# Patient Record
Sex: Female | Born: 1965 | Race: White | Hispanic: No | Smoking: Former smoker
Health system: Southern US, Community
[De-identification: ages and names within clinical notes are randomized; demographics above are authoritative.]

## PROBLEM LIST (undated history)

## (undated) DIAGNOSIS — Z8719 Personal history of other diseases of the digestive system: Secondary | ICD-10-CM

## (undated) DIAGNOSIS — N289 Disorder of kidney and ureter, unspecified: Secondary | ICD-10-CM

## (undated) DIAGNOSIS — C801 Malignant (primary) neoplasm, unspecified: Secondary | ICD-10-CM

## (undated) DIAGNOSIS — J189 Pneumonia, unspecified organism: Secondary | ICD-10-CM

## (undated) DIAGNOSIS — I10 Essential (primary) hypertension: Secondary | ICD-10-CM

## (undated) DIAGNOSIS — A4902 Methicillin resistant Staphylococcus aureus infection, unspecified site: Secondary | ICD-10-CM

## (undated) DIAGNOSIS — F419 Anxiety disorder, unspecified: Secondary | ICD-10-CM

## (undated) DIAGNOSIS — K219 Gastro-esophageal reflux disease without esophagitis: Secondary | ICD-10-CM

## (undated) HISTORY — PX: BIOPSY BREAST: PRO8

## (undated) HISTORY — PX: OTHER SURGICAL HISTORY: SHX169

## (undated) HISTORY — PX: ABDOMINAL SURGERY: SHX537

---

## 2013-06-21 HISTORY — PX: OTHER SURGICAL HISTORY: SHX169

## 2018-01-13 ENCOUNTER — Emergency Department (HOSPITAL_COMMUNITY)
Admission: EM | Admit: 2018-01-13 | Discharge: 2018-01-14 | Disposition: A | Discharge status: Other Acute Inpatient Hospital | Payer: Medicaid Other | Attending: Emergency Medicine | Admitting: Emergency Medicine

## 2018-01-13 ENCOUNTER — Emergency Department (HOSPITAL_COMMUNITY): Payer: Medicaid Other

## 2018-01-13 ENCOUNTER — Encounter (HOSPITAL_COMMUNITY): Payer: Self-pay | Admitting: Emergency Medicine

## 2018-01-13 DIAGNOSIS — J189 Pneumonia, unspecified organism: Secondary | ICD-10-CM

## 2018-01-13 DIAGNOSIS — I10 Essential (primary) hypertension: Secondary | ICD-10-CM | POA: Diagnosis not present

## 2018-01-13 DIAGNOSIS — N289 Disorder of kidney and ureter, unspecified: Secondary | ICD-10-CM | POA: Diagnosis not present

## 2018-01-13 DIAGNOSIS — Z85118 Personal history of other malignant neoplasm of bronchus and lung: Secondary | ICD-10-CM | POA: Insufficient documentation

## 2018-01-13 DIAGNOSIS — Z79899 Other long term (current) drug therapy: Secondary | ICD-10-CM | POA: Diagnosis not present

## 2018-01-13 DIAGNOSIS — J9601 Acute respiratory failure with hypoxia: Secondary | ICD-10-CM | POA: Diagnosis present

## 2018-01-13 DIAGNOSIS — R042 Hemoptysis: Secondary | ICD-10-CM | POA: Diagnosis present

## 2018-01-13 DIAGNOSIS — Z87891 Personal history of nicotine dependence: Secondary | ICD-10-CM | POA: Insufficient documentation

## 2018-01-13 HISTORY — DX: Methicillin resistant Staphylococcus aureus infection, unspecified site: A49.02

## 2018-01-13 HISTORY — DX: Anxiety disorder, unspecified: F41.9

## 2018-01-13 HISTORY — DX: Gastro-esophageal reflux disease without esophagitis: K21.9

## 2018-01-13 HISTORY — DX: Personal history of other diseases of the digestive system: Z87.19

## 2018-01-13 HISTORY — DX: Disorder of kidney and ureter, unspecified: N28.9

## 2018-01-13 HISTORY — DX: Malignant (primary) neoplasm, unspecified: C80.1

## 2018-01-13 HISTORY — DX: Essential (primary) hypertension: I10

## 2018-01-13 HISTORY — DX: Pneumonia, unspecified organism: J18.9

## 2018-01-13 LAB — CBC WITH DIFFERENTIAL/PLATELET
Basophils Absolute: 0 10*3/uL (ref 0.0–0.1)
Basophils Relative: 0 %
Eosinophils Absolute: 0 10*3/uL (ref 0.0–0.7)
Eosinophils Relative: 0 %
HCT: 25.6 % — ABNORMAL LOW (ref 36.0–46.0)
Hemoglobin: 7.9 g/dL — ABNORMAL LOW (ref 12.0–15.0)
Lymphocytes Relative: 8 %
Lymphs Abs: 0.6 10*3/uL — ABNORMAL LOW (ref 0.7–4.0)
MCH: 26.3 pg (ref 26.0–34.0)
MCHC: 30.9 g/dL (ref 30.0–36.0)
MCV: 85.3 fL (ref 78.0–100.0)
Monocytes Absolute: 0.4 10*3/uL (ref 0.1–1.0)
Monocytes Relative: 5 %
Neutro Abs: 6.3 10*3/uL (ref 1.7–7.7)
Neutrophils Relative %: 87 %
Platelets: 235 10*3/uL (ref 150–400)
RBC: 3 MIL/uL — ABNORMAL LOW (ref 3.87–5.11)
RDW: 16.1 % — ABNORMAL HIGH (ref 11.5–15.5)
WBC: 7.2 10*3/uL (ref 4.0–10.5)

## 2018-01-13 LAB — BASIC METABOLIC PANEL
Anion gap: 10 (ref 5–15)
BUN: 27 mg/dL — ABNORMAL HIGH (ref 6–20)
CO2: 27 mmol/L (ref 22–32)
Calcium: 9.1 mg/dL (ref 8.9–10.3)
Chloride: 96 mmol/L — ABNORMAL LOW (ref 98–111)
Creatinine, Ser: 2.19 mg/dL — ABNORMAL HIGH (ref 0.44–1.00)
GFR calc Af Amer: 29 mL/min — ABNORMAL LOW (ref >60)
GFR calc non Af Amer: 25 mL/min — ABNORMAL LOW (ref >60)
Glucose, Bld: 97 mg/dL (ref 70–99)
Potassium: 3.9 mmol/L (ref 3.5–5.1)
Sodium: 133 mmol/L — ABNORMAL LOW (ref 135–145)

## 2018-01-13 LAB — URINALYSIS, ROUTINE W REFLEX MICROSCOPIC
Bilirubin Urine: NEGATIVE
Glucose, UA: NEGATIVE mg/dL
Ketones, ur: NEGATIVE mg/dL
Nitrite: NEGATIVE
Protein, ur: 30 mg/dL — AB
RBC / HPF: >50 RBC/hpf — ABNORMAL HIGH (ref 0–5)
Specific Gravity, Urine: 1.016 (ref 1.005–1.030)
WBC, UA: >50 WBC/hpf — ABNORMAL HIGH (ref 0–5)
pH: 5 (ref 5.0–8.0)

## 2018-01-13 LAB — I-STAT CG4 LACTIC ACID, ED: Lactic Acid, Venous: 1.22 mmol/L (ref 0.5–1.9)

## 2018-01-13 LAB — D-DIMER, QUANTITATIVE (NOT AT ARMC): D-Dimer, Quant: 3.33 ug/mL-FEU — ABNORMAL HIGH (ref 0.00–0.50)

## 2018-01-13 MED ORDER — BENZONATATE 100 MG PO CAPS
100.0000 mg | ORAL_CAPSULE | Freq: Once | ORAL | Status: AC
  Administered 2018-01-13: 100 mg via ORAL
  Filled 2018-01-13: qty 1

## 2018-01-13 MED ORDER — SODIUM CHLORIDE 0.9 % IV SOLN
2.0000 g | Freq: Once | INTRAVENOUS | Status: AC
  Administered 2018-01-13: 2 g via INTRAVENOUS
  Filled 2018-01-13: qty 2

## 2018-01-13 MED ORDER — SODIUM CHLORIDE 0.9 % IV BOLUS
500.0000 mL | Freq: Once | INTRAVENOUS | Status: AC
  Administered 2018-01-13: 500 mL via INTRAVENOUS

## 2018-01-13 MED ORDER — ACETAMINOPHEN 325 MG PO TABS
650.0000 mg | ORAL_TABLET | Freq: Once | ORAL | Status: AC
  Administered 2018-01-13: 650 mg via ORAL
  Filled 2018-01-13: qty 2

## 2018-01-13 MED ORDER — VANCOMYCIN HCL IN DEXTROSE 1-5 GM/200ML-% IV SOLN
1000.0000 mg | Freq: Once | INTRAVENOUS | Status: AC
  Administered 2018-01-13: 1000 mg via INTRAVENOUS
  Filled 2018-01-13: qty 200

## 2018-01-13 MED ORDER — TECHNETIUM TC 99M DIETHYLENETRIAME-PENTAACETIC ACID
32.3000 | Freq: Once | INTRAVENOUS | Status: AC | PRN
  Administered 2018-01-13: 32.3 via INTRAVENOUS

## 2018-01-13 MED ORDER — SODIUM CHLORIDE 0.9 % IV BOLUS
1000.0000 mL | Freq: Once | INTRAVENOUS | Status: AC
  Administered 2018-01-13: 1000 mL via INTRAVENOUS

## 2018-01-13 MED ORDER — TECHNETIUM TO 99M ALBUMIN AGGREGATED
3.9000 | Freq: Once | INTRAVENOUS | Status: AC | PRN
  Administered 2018-01-13: 3.9 via INTRAVENOUS

## 2018-01-13 NOTE — ED Notes (Signed)
Informed pt of need for Urine.

## 2018-01-13 NOTE — ED Notes (Signed)
Attempted to call report, nurse requesting 5 minutes

## 2018-01-13 NOTE — Progress Notes (Signed)
A consult was received from an ED physician for vancomycin + cefepime per pharmacy dosing.  The patient's profile has been reviewed for ht/wt/allergies/indication/available labs.    A one time order has been placed for cefepime 2 g IV and vancomycin 1000 mg IV.  Further antibiotics/pharmacy consults should be ordered by admitting physician if indicated.                       Thank you, Lenis Noon, PharmD, BCPS Clinical Pharmacist 01/13/2018  6:54 PM

## 2018-01-13 NOTE — ED Triage Notes (Signed)
Per EMS-states she started coughing up bright red blood last night-darker this am-states history of lung cancer-lungs clear, no other complaints

## 2018-01-13 NOTE — ED Notes (Addendum)
Mina,PA made aware of patient D-dimer.

## 2018-01-13 NOTE — ED Notes (Signed)
Patient transported to vq scan  

## 2018-01-13 NOTE — ED Notes (Signed)
Spoke with Barbara Andrews at [facility] for transport

## 2018-01-13 NOTE — ED Provider Notes (Addendum)
[facility] DEPT Provider Note   CSN: [id number] Arrival date & time: 01/13/18  1233     History   Chief Complaint Chief Complaint  Patient presents with  . coughing up blood    HPI Barbara Andrews is a 52 y.o. female with history of CKD, lung osteosarcoma, hypertension presents for evaluation of acute onset, somewhat improving hemoptysis since yesterday.  She states that due to her history of lung cancer she will have "flareups "of hemoptysis.  Yesterday she noticed she was coughing up bright red blood which was darker this morning.  She states that it typically worsens at night but improves in the mornings.  She does note shortness of breath with exertion and generalized fatigue.  She is currently in a rehabilitation facility due to progressive weakness after an ablation procedure of the lung.  Her oncologist is with [facility].  She endorses aching right-sided chest pains with cough only.  Denies fevers or chills. Has had PE in the past while undergoing chemotherapy and was on Eliquis but is not currently anticoagulated because "I was bleeding too much"  The history is provided by the patient.    Past Medical History:  Diagnosis Date  . Anxiety   . Cancer ([place])    hodgkins lymphoma - 2001, lung cancer - 2018  . GERD (gastroesophageal reflux disease)   . History of hiatal hernia   . Hypertension   . MRSA (methicillin resistant Staphylococcus aureus)   . Pneumonia   . Renal disorder     Patient Active Problem List   Diagnosis Date Noted  . Hemoptysis 01/13/2018  . Acute respiratory failure with hypoxia ([place]) 01/13/2018    Past Surgical History:  Procedure Laterality Date  . ABDOMINAL SURGERY  age 74 months   tumor removed from above ovary  . BIOPSY BREAST     to diagnose hodgkins per pt  . OTHER SURGICAL HISTORY  2015   fallopian tube and right ovary removed  . OTHER SURGICAL HISTORY     2 port a caths     OB  History   None      Home Medications    Prior to Admission medications   Medication Sig Start Date End Date Taking? Authorizing Provider  benzonatate (TESSALON) 100 MG capsule Take 200 mg by mouth 3 (three) times daily as needed for cough.   Yes [provider]  HYDROcodone-homatropine (HYCODAN) 5-1.5 MG/5ML syrup Take 5 mLs by mouth every 4 (four) hours as needed for cough.   Yes [provider]  Melatonin 3 MG TABS Take 3 mg by mouth at bedtime.   Yes [provider]  Multiple Vitamin (MULTIVITAMIN WITH MINERALS) TABS tablet Take 1 tablet by mouth daily.   Yes [provider]    Family History History reviewed. No pertinent family history.  Social History Social History   Tobacco Use  . Smoking status: Former Smoker    Years: 38.00    Types: Cigarettes    Last attempt to quit: 01/14/2015    Years since quitting: 3.0  . Smokeless tobacco: Never Used  Substance Use Topics  . Alcohol use: Never    Frequency: Never  . Drug use: Never     Allergies   Levaquin [levofloxacin in d5w]   Review of Systems Review of Systems  Constitutional: Positive for fatigue. Negative for chills and fever.  Respiratory: Positive for cough and shortness of breath.   Cardiovascular: Positive for chest pain.  Negative for leg swelling.  All other systems reviewed and are negative.    Physical Exam Updated Vital Signs BP (!) 150/94 (BP Location: Left Arm)   Pulse (!) 107   Temp (!) 101.1 F (38.4 C) (Rectal)   Resp 14   SpO2 96%   Physical Exam  Constitutional: She appears well-developed and well-nourished. No distress.  Resting in bed appears older than stated age.  HENT:  Head: Normocephalic and atraumatic.  Eyes: Conjunctivae are normal. Right eye exhibits no discharge. Left eye exhibits no discharge.  Neck: No JVD present. No tracheal deviation present.  Cardiovascular:  Tachycardic, 2+ radial and DP/PT pulses bilaterally, Homans sign  absent bilaterally, no lower extremity edema, no palpable cords, compartments are soft   Pulmonary/Chest: Effort normal and breath sounds normal. She exhibits tenderness.  Right anterior chest wall minimally tender to palpation.  No deformity, crepitus, ecchymosis, or flail segment noted  Abdominal: Soft. Bowel sounds are normal. She exhibits no distension. There is no tenderness.  Musculoskeletal: She exhibits no edema.  Neurological: She is alert.  Skin: Skin is warm and dry. No erythema.  Psychiatric: She has a normal mood and affect. Her behavior is normal.  Nursing note and vitals reviewed.    ED Treatments / Results  Labs (all labs ordered are listed, but only abnormal results are displayed) Labs Reviewed  BASIC METABOLIC PANEL - Abnormal; Notable for the following components:      Result Value   Sodium 133 (*)    Chloride 96 (*)    BUN 27 (*)    Creatinine, Ser 2.19 (*)    GFR calc non Af Amer 25 (*)    GFR calc Af Amer 29 (*)    All other components within normal limits  CBC WITH DIFFERENTIAL/PLATELET - Abnormal; Notable for the following components:   RBC 3.00 (*)    Hemoglobin 7.9 (*)    HCT 25.6 (*)    RDW 16.1 (*)    Lymphs Abs 0.6 (*)    All other components within normal limits  D-DIMER, QUANTITATIVE (NOT AT [facility]) - Abnormal; Notable for the following components:   D-Dimer, Quant 3.33 (*)    All other components within normal limits  CULTURE, BLOOD (ROUTINE X 2)  CULTURE, BLOOD (ROUTINE X 2)  URINALYSIS, ROUTINE W REFLEX MICROSCOPIC  I-STAT CG4 LACTIC ACID, ED    EKG None  Radiology Dg Chest 2 View  Result Date: 01/13/2018 CLINICAL DATA:  Cough, hemoptysis.  History of lymphoma EXAM: CHEST - 2 VIEW COMPARISON:  None. FINDINGS: Extensive right upper lobe opacity. Possible cavitation with internal soft tissue lobulations. Adjacent pleural thickening in the right apex. Left lung clear. No pleural effusion. Heart size and vascularity normal. IMPRESSION:  Extensive right upper lobe density. Differential diagnosis includes pulmonary lymphoma given the clinical history of prior lymphoma. Carcinoma of the lung is possible. Pulmonary infection is possible including atypical infection. Possible cavitation. CT chest with contrast recommended for further evaluation. Electronically Signed   By: Franchot Gallo M.D.   On: 01/13/2018 14:46   Ct Chest Wo Contrast  Result Date: 01/13/2018 CLINICAL DATA:  Hemoptysis.  History of lung cancer. EXAM: CT CHEST WITHOUT CONTRAST TECHNIQUE: Multidetector CT imaging of the chest was performed following the standard protocol without IV contrast. COMPARISON:  Chest x-ray 01/13/2018. FINDINGS: Cardiovascular: The heart is normal in size. Small to moderate-sized pericardial effusion. The aorta is normal in caliber. Scattered atherosclerotic calcifications. Age advanced coronary artery calcifications. Mediastinum/Nodes: Large right upper  lobe calcified mass is invading the mediastinum. There also borderline enlarged mediastinal lymph nodes. Soft tissue fullness in the infrahilar region with air bronchograms could be radiation change. Lungs/Pleura: Large branching calcified mass in the right upper lobe invading the mediastinum. This appears to cast dilated bronchi with central debris. I suspect patient and bronchiectasis and bronchus seals with dystrophic calcification and may be secondary radiation changes. This does not have the appearance of a discrete mass but surely the patient has had imaging previously. That would be extremely helpful. There is compression of the bronchus intermedius and right upper lobe bronchus. Large apical lung cavity with marked pleural thickening. Cannot exclude pleural tumor or previously treated tumor. The left lung is relatively clear. There are mild emphysematous changes but no worrisome pulmonary nodules. The right lower lobe is also clear below the radiation changes. Upper Abdomen: Splenomegaly is noted.  I do not see any obvious cirrhotic changes involving the liver. No adrenal gland lesions or upper abdominal adenopathy. Musculoskeletal: No breast masses, supraclavicular or axillary adenopathy. IMPRESSION: 1. Large irregular branching appearing calcified lesion in the right upper lobe invading the mediastinum. This is likely dystrophic calcification related to remote infection and bronchiectasis and possibly radiation. 2. Soft tissue fullness in the right hilum and right infrahilar regions with air bronchograms. I think this is most likely radiation fibrosis but could not exclude infection. 3. A largely cavitary right upper lobe with thick irregular pleural disease, possibly tumor or treated tumor. 4. Underline emphysematous changes. The left lung and most of the right lower lobe and middle lobe appear grossly normal. 5. Small to moderate-sized pericardial effusion. 6. Splenomegaly without obvious changes of cirrhosis. Aortic Atherosclerosis (ICD10-I70.0) and Emphysema (ICD10-J43.9). Electronically Signed   By: Marijo Sanes M.D.   On: 01/13/2018 18:30   Nm Pulmonary Vent And Perf (v/q Scan)  Result Date: 01/13/2018 CLINICAL DATA:  Shortness of breath with hemoptysis. History of lung carcinoma on the right EXAM: NUCLEAR MEDICINE VENTILATION - PERFUSION LUNG SCAN VIEWS: Anterior, posterior, right lateral, left lateral, RPO, LPO, RAO, LAO-ventilation and perfusion RADIOPHARMACEUTICALS:  32.3 mCi of Tc-36m DTPA aerosol inhalation and 3.9 mCi Tc5m-MAA IV COMPARISON:  Chest radiograph and chest CT July 12/08/2017 FINDINGS: Ventilation: There is photopenia throughout much of the right upper lobe. Elsewhere, the distribution of uptake is within normal limits on the ventilation study. Perfusion: There is photopenia throughout most of the right upper lobe in a distribution matching the ventilation defect. Note that there is consolidation in this area on chest radiography and chest CT. Elsewhere, the distribution of  uptake is within normal limits. IMPRESSION: Matching right upper lobe defect which corresponds to consolidations seen on chest radiograph and chest CT. There is no appreciable ventilation/perfusion mismatch. Ventilation perfusion studies otherwise appear unremarkable. This study constitutes a low probability of pulmonary embolus based on PIOPED II criteria. Electronically Signed   By: Lowella Grip III M.D.   On: 01/13/2018 16:46    Procedures .Critical Care Performed by: Renita Papa, PA-C Authorized by: Renita Papa, PA-C   Critical care provider statement:    Critical care time (minutes):  45   Critical care was necessary to treat or prevent imminent or life-threatening deterioration of the following conditions:  Respiratory failure   Critical care was time spent personally by me on the following activities:  Blood draw for specimens, development of treatment plan with patient or surrogate, discussions with consultants, discussions with primary provider, obtaining history from patient or surrogate, re-evaluation of patient's  condition, ordering and review of radiographic studies and ordering and review of laboratory studies   (including critical care time)  Medications Ordered in ED Medications  sodium chloride 0.9 % bolus 500 mL (0 mLs Intravenous Stopped 01/13/18 1655)  technetium TC 46M diethylenetriame-pentaacetic acid (DTPA) injection 00.1 millicurie (74.9 millicuries Intravenous Given 01/13/18 1603)  technetium albumin aggregated (MAA) injection solution 3.9 millicurie (3.9 millicuries Intravenous Contrast Given 01/13/18 1615)  sodium chloride 0.9 % bolus 1,000 mL (0 mLs Intravenous Stopped 01/13/18 2026)  ceFEPIme (MAXIPIME) 2 g in sodium chloride 0.9 % 100 mL IVPB (0 g Intravenous Stopped 01/13/18 2006)  vancomycin (VANCOCIN) IVPB 1000 mg/200 mL premix (0 mg Intravenous Stopped 01/13/18 2133)  acetaminophen (TYLENOL) tablet 650 mg (650 mg Oral Given 01/13/18 1958)  benzonatate  (TESSALON) capsule 100 mg (100 mg Oral Given 01/13/18 1958)     Initial Impression / Assessment and Plan / ED Course  I have reviewed the triage vital signs and the nursing notes.  Pertinent labs & imaging results that were available during my care of the patient were reviewed by me and considered in my medical decision making (see chart for details).     Patient presents for evaluation of hemoptysis.  She has a history of osteosarcoma of the lung, PE, and infective endocarditis.  She was recently admitted for the latter in May at an outside hospital and recently had PICC line removed.  She is febrile to 101.1 F, persistently tachycardic and tachypneic while in the ED.  Lab work reviewed by me shows no leukocytosis, hemoglobin 7.9 which based on chart review was around her baseline.  She also has an elevated creatinine of 2.19 with her baseline of around 1.7-2.0.  Lactate is within normal limits.  She does not appear to be overtly septic at this time although she is chronically ill in appearance.  She has an elevated d-dimer but with her CKD, not a good candidate for CTA.  VQ scan shows a matching right upper lobe defect which corresponds with consolidation seen on chest radiograph and chest CT.  There is no appreciable ventilation/perfusion mismatch.  Study indicates low probability for PE.  CT of the chest shows a large irregular branching appearing calcified lesion in the right upper lobe invading the mediastinum.  Also shows soft tissue fullness in the right hilum and right infrahilar regions with air bronchograms which could be radiation fibrosis although infection is not excluded.  Given fever and hemoptysis, will treat empirically for HCAP.  She will require admission.  Discussed with patient, who elects for transfer to [facility] which I think is reasonable given they have managed all of her medical care thus far. 9:05 PM Spoke with Dr. Dorthula Matas with [facility] at [facility] who accepts transfer and will assume care of the patient for further evaluation management.  She is hemodynamically stable for transfer at this time.  Final Clinical Impressions(s) / ED Diagnoses   Final diagnoses:  HCAP (healthcare-associated pneumonia)    ED Discharge Orders    None       Debroah Baller 01/13/18 2308    Milton Ferguson, MD 01/13/18 2326  9:44 PM Documented Critical Care time.     Renita Papa, PA-C 01/24/18 2144    Milton Ferguson, MD 01/31/18 1459

## 2018-01-13 NOTE — ED Notes (Signed)
Patient back in room

## 2018-01-13 NOTE — ED Notes (Signed)
Urine Cx sent with U/A. Nancy Nordmann

## 2018-01-14 ENCOUNTER — Encounter (HOSPITAL_COMMUNITY): Payer: Self-pay | Admitting: Emergency Medicine

## 2018-01-14 NOTE — ED Notes (Signed)
Spoke with [facility], ETA about 1 hour

## 2018-01-14 NOTE — ED Notes (Signed)
[facility] arrived

## 2018-01-14 NOTE — ED Notes (Signed)
Spoke with Gerald Stabs with [facility] transport, transport will be about 3 hours before arrival.

## 2018-01-18 LAB — CULTURE, BLOOD (ROUTINE X 2)
Culture: NO GROWTH
Culture: NO GROWTH
Special Requests: ADEQUATE

## 2019-04-26 IMAGING — CT CT CHEST W/O CM
2 of 3 series · 15 of 36 positions shown, 18 images · non-contrast
Comparison: Chest x-ray 01/13/2018.

CLINICAL DATA: Hemoptysis.  History of lung cancer.

EXAM:
CT CHEST WITHOUT CONTRAST
TECHNIQUE: Multidetector CT imaging of the chest was performed following the
standard protocol without IV contrast.

[Series 2: thorax · axial · 0.68mm/px · z∈[+1333,+1639]mm · 12 of 181 slices shown, 15 images]
[im 14/181  mediastinal]
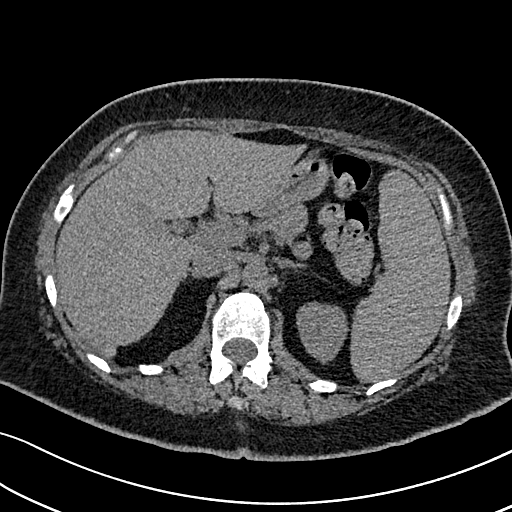
[im 14/181  lung]
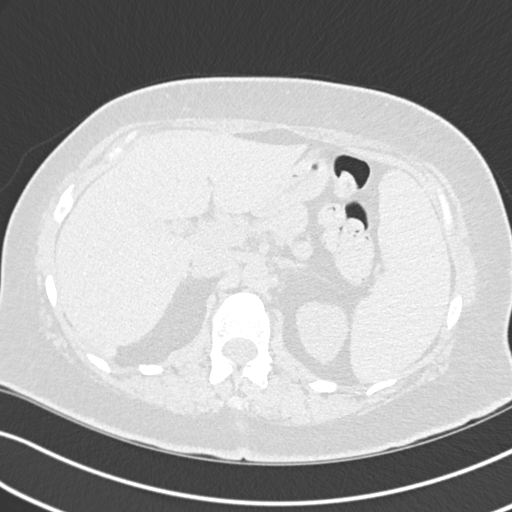
[im 27/181  lung]
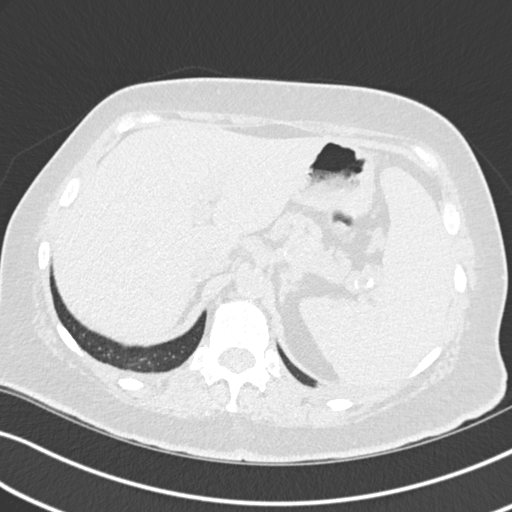
[im 41/181  lung]
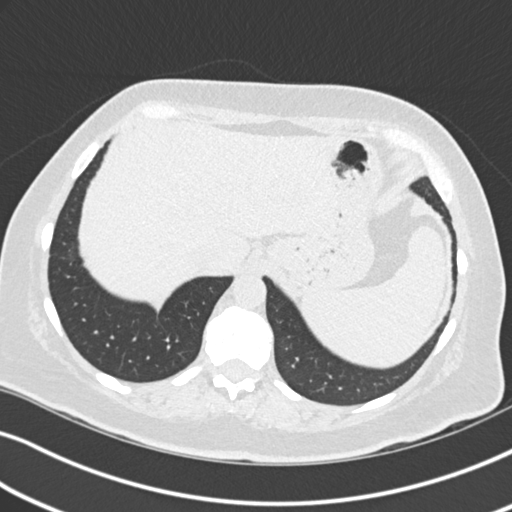
[im 54/181  lung]
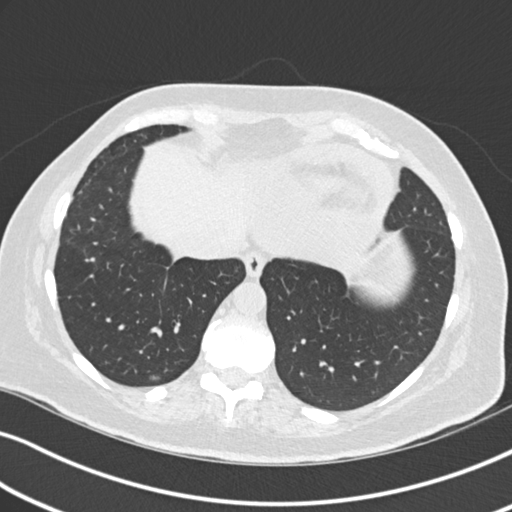
[im 67/181  mediastinal]
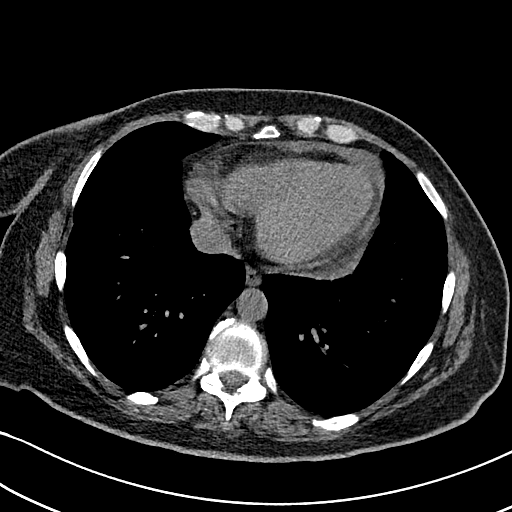
[im 67/181  lung]
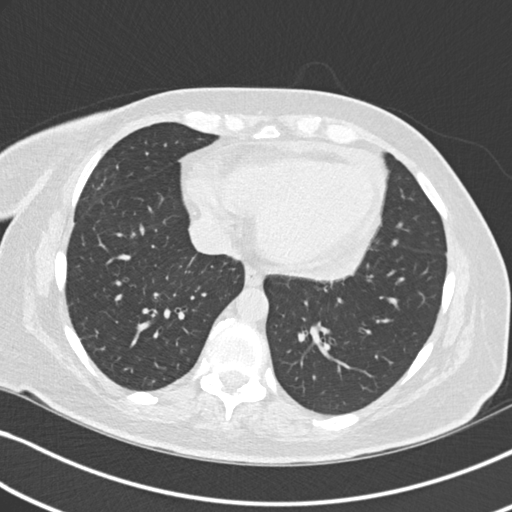
[im 81/181  lung]
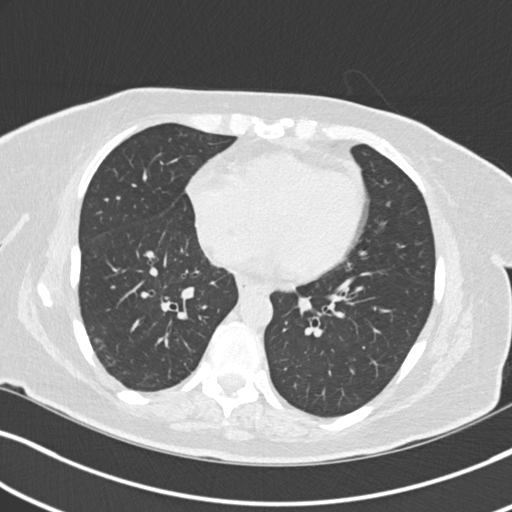
[im 101/181  lung]
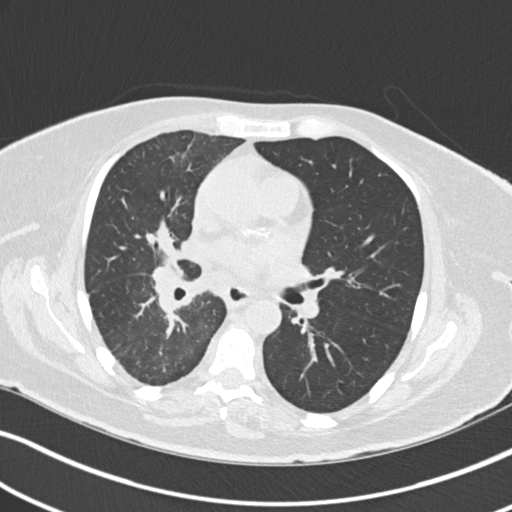
[im 114/181  lung]
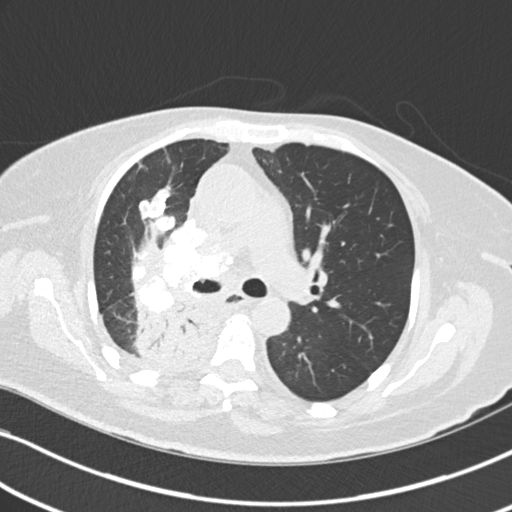
[im 127/181  mediastinal]
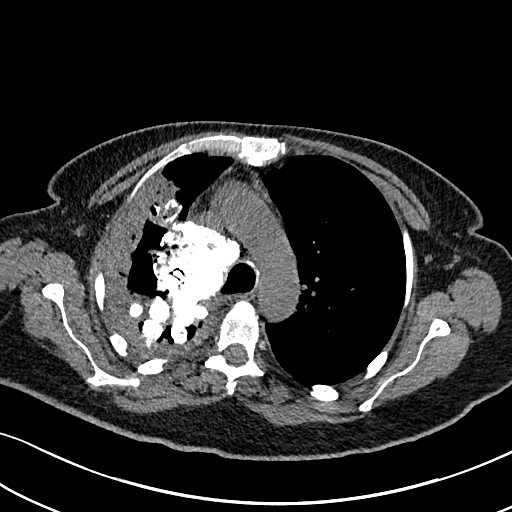
[im 127/181  lung]
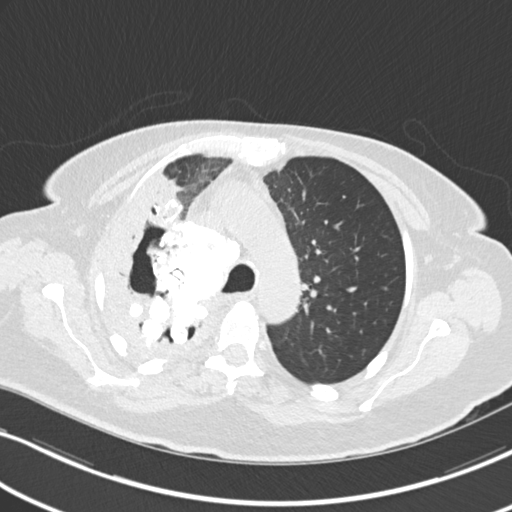
[im 141/181  lung]
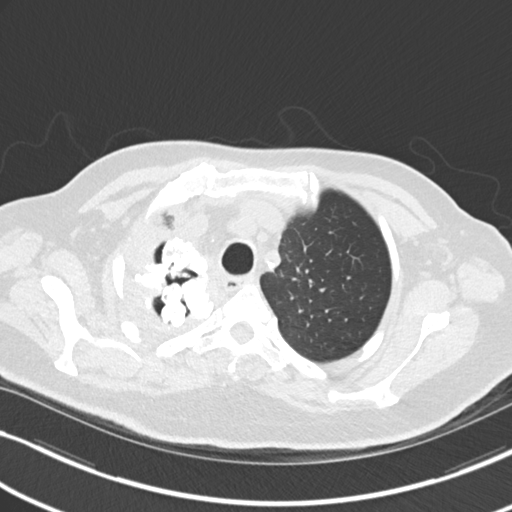
[im 154/181  lung]
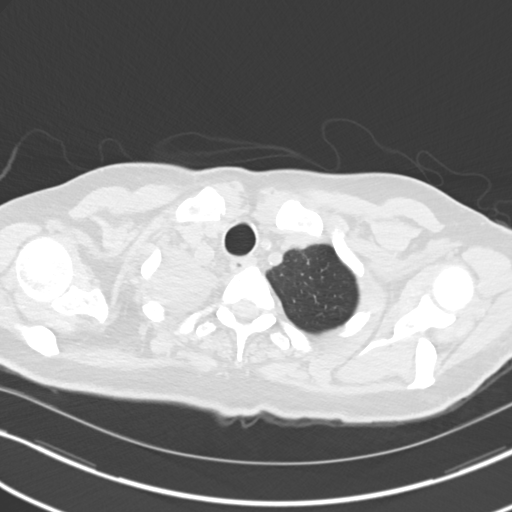
[im 167/181  lung]
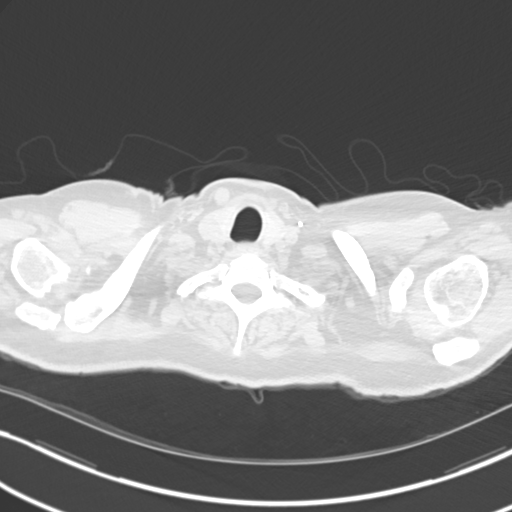

[Series 6: coronal · coronal · 0.71mm/px · 3 of 140 slices shown]
[im 28/140  lung]
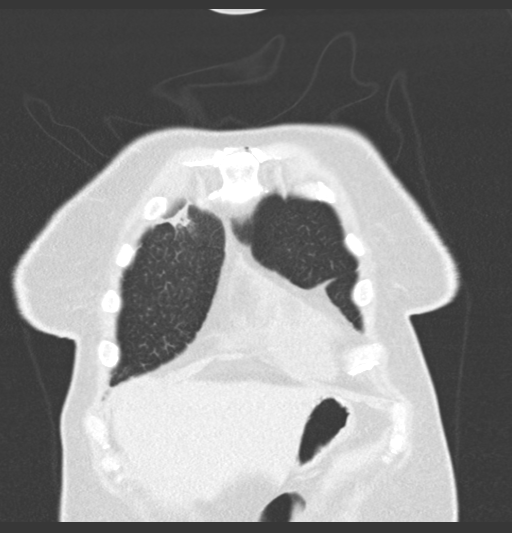
[im 56/140  lung]
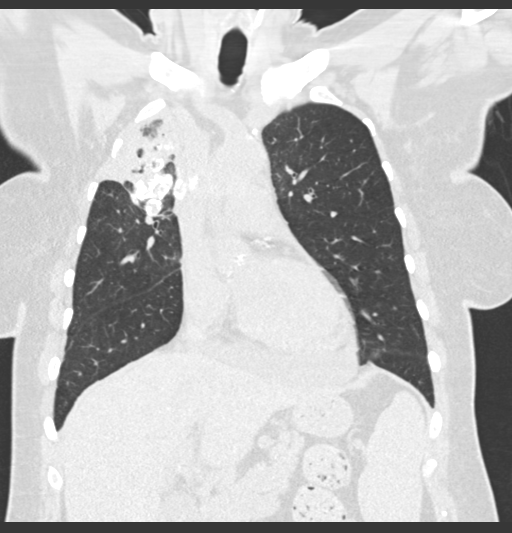
[im 84/140  lung]
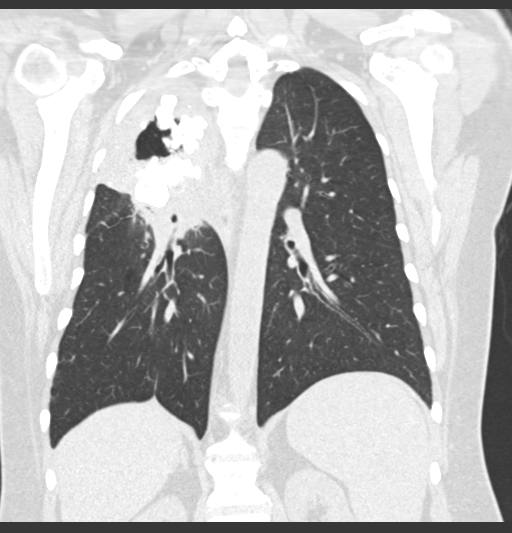

[15 of 36 positions shown; findings below may reference images not displayed]

FINDINGS: Cardiovascular: The heart is normal in size. Small to moderate-sized
pericardial effusion. The aorta is normal in caliber. Scattered
atherosclerotic calcifications. Age advanced coronary artery
calcifications.

Mediastinum/Nodes: Large right upper lobe calcified mass is invading
the mediastinum. There also borderline enlarged mediastinal lymph
nodes. Soft tissue fullness in the infrahilar region with air
bronchograms could be radiation change.

Lungs/Pleura: Large branching calcified mass in the right upper lobe
invading the mediastinum. This appears to cast dilated bronchi with
central debris. I suspect patient and bronchiectasis and bronchus
seals with dystrophic calcification and may be secondary radiation
changes. This does not have the appearance of a discrete mass but
surely the patient has had imaging previously. That would be
extremely helpful. There is compression of the bronchus intermedius
and right upper lobe bronchus.

Large apical lung cavity with marked pleural thickening. Cannot
exclude pleural tumor or previously treated tumor.

The left lung is relatively clear. There are mild emphysematous
changes but no worrisome pulmonary nodules. The right lower lobe is
also clear below the radiation changes.

Upper Abdomen: Splenomegaly is noted. I do not see any obvious
cirrhotic changes involving the liver. No adrenal gland lesions or
upper abdominal adenopathy.

Musculoskeletal: No breast masses, supraclavicular or axillary
adenopathy.
IMPRESSION: 1. Large irregular branching appearing calcified lesion in the right
upper lobe invading the mediastinum. This is likely dystrophic
calcification related to remote infection and bronchiectasis and
possibly radiation.
2. Soft tissue fullness in the right hilum and right infrahilar
regions with air bronchograms. I think this is most likely radiation
fibrosis but could not exclude infection.
3. A largely cavitary right upper lobe with thick irregular pleural
disease, possibly tumor or treated tumor.
4. Underline emphysematous changes. The left lung and most of the
right lower lobe and middle lobe appear grossly normal.
5. Small to moderate-sized pericardial effusion.
6. Splenomegaly without obvious changes of cirrhosis.

Aortic Atherosclerosis (VXE8R-POT.T) and Emphysema (VXE8R-IU5.A).
